# Patient Record
Sex: Female | Born: 1970 | Race: Black or African American | Hispanic: No | Marital: Married | Smoking: Current some day smoker
Health system: Southern US, Community
[De-identification: ages and names within clinical notes are randomized; demographics above are authoritative.]

## PROBLEM LIST (undated history)

## (undated) DIAGNOSIS — J302 Other seasonal allergic rhinitis: Secondary | ICD-10-CM

## (undated) DIAGNOSIS — I10 Essential (primary) hypertension: Secondary | ICD-10-CM

## (undated) DIAGNOSIS — E7800 Pure hypercholesterolemia, unspecified: Secondary | ICD-10-CM

## (undated) HISTORY — PX: TUBAL LIGATION: SHX77

---

## 2007-08-09 ENCOUNTER — Emergency Department (HOSPITAL_COMMUNITY): Admission: EM | Admit: 2007-08-09 | Discharge: 2007-08-09 | Payer: Self-pay | Admitting: Emergency Medicine

## 2008-03-29 ENCOUNTER — Emergency Department (HOSPITAL_COMMUNITY): Admission: EM | Admit: 2008-03-29 | Discharge: 2008-03-30 | Payer: Self-pay | Admitting: Emergency Medicine

## 2009-12-08 ENCOUNTER — Ambulatory Visit: Payer: Self-pay | Admitting: Diagnostic Radiology

## 2009-12-08 ENCOUNTER — Emergency Department (HOSPITAL_BASED_OUTPATIENT_CLINIC_OR_DEPARTMENT_OTHER): Admission: EM | Admit: 2009-12-08 | Discharge: 2009-12-08 | Payer: Self-pay | Admitting: Emergency Medicine

## 2014-12-08 ENCOUNTER — Emergency Department (HOSPITAL_BASED_OUTPATIENT_CLINIC_OR_DEPARTMENT_OTHER)
Admission: EM | Admit: 2014-12-08 | Discharge: 2014-12-08 | Disposition: A | Discharge status: Home / Self Care | Payer: Self-pay | Attending: Emergency Medicine | Admitting: Emergency Medicine

## 2014-12-08 ENCOUNTER — Emergency Department (HOSPITAL_BASED_OUTPATIENT_CLINIC_OR_DEPARTMENT_OTHER): Payer: Self-pay

## 2014-12-08 ENCOUNTER — Encounter (HOSPITAL_BASED_OUTPATIENT_CLINIC_OR_DEPARTMENT_OTHER): Payer: Self-pay | Admitting: Emergency Medicine

## 2014-12-08 DIAGNOSIS — Z8639 Personal history of other endocrine, nutritional and metabolic disease: Secondary | ICD-10-CM | POA: Insufficient documentation

## 2014-12-08 DIAGNOSIS — R52 Pain, unspecified: Secondary | ICD-10-CM

## 2014-12-08 DIAGNOSIS — M25562 Pain in left knee: Secondary | ICD-10-CM | POA: Insufficient documentation

## 2014-12-08 DIAGNOSIS — Z720 Tobacco use: Secondary | ICD-10-CM | POA: Insufficient documentation

## 2014-12-08 DIAGNOSIS — I10 Essential (primary) hypertension: Secondary | ICD-10-CM | POA: Insufficient documentation

## 2014-12-08 HISTORY — DX: Essential (primary) hypertension: I10

## 2014-12-08 HISTORY — DX: Other seasonal allergic rhinitis: J30.2

## 2014-12-08 HISTORY — DX: Pure hypercholesterolemia, unspecified: E78.00

## 2014-12-08 MED ORDER — IBUPROFEN 800 MG PO TABS: 800.0000 mg | ORAL_TABLET | Freq: Three times a day (TID) | ORAL | Status: DC

## 2014-12-08 MED ORDER — HYDROCODONE-ACETAMINOPHEN 5-325 MG PO TABS: 2.0000 | ORAL_TABLET | ORAL | Status: DC | PRN

## 2014-12-08 MED ORDER — IBUPROFEN 800 MG PO TABS
800.0000 mg | ORAL_TABLET | Freq: Once | ORAL | Status: AC
  Administered 2014-12-08: 800 mg via ORAL
  Filled 2014-12-08: qty 1

## 2014-12-08 NOTE — Discharge Instructions (Signed)

## 2014-12-08 NOTE — ED Notes (Signed)
Left knee pain and swelling since before Thanksgiving with no known injury.

## 2014-12-08 NOTE — ED Provider Notes (Signed)
CSN: [id number]     Arrival date & time 12/08/14  1729 History   First MD Initiated Contact with Patient 12/08/14 1740     Chief Complaint  Patient presents with  . Knee Pain     (Consider location/radiation/quality/duration/timing/severity/associated sxs/prior Treatment) HPI Comments: Constant left lateral knee pain since November 26. Denies any injury or fall. 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 Rest makes it better, putting weight on it makes it worse. No fevers, chills, nausea or vomiting. No chest pain or shortness of breath. No weakness, numbness or tingling. No other joint pain. Took ibuprofen 1 without relief.  The history is provided by the patient.    Past Medical History  Diagnosis Date  . Hypertension   . High cholesterol   . Seasonal allergies    Past Surgical History  Procedure Laterality Date  . Tubal ligation     No family history on file. History  Substance Use Topics  . Smoking status: Current Some Day Smoker  . Smokeless tobacco: Not on file  . Alcohol Use: Yes     Comment: occ   OB History    No data available     Review of Systems  Constitutional: Negative for fever, activity change and appetite change.  Respiratory: Negative for cough, chest tightness and shortness of breath.   Cardiovascular: Negative for chest pain.  Gastrointestinal: Negative for nausea, vomiting and abdominal pain.  Genitourinary: Negative for dysuria and hematuria.  Musculoskeletal: Positive for myalgias and arthralgias. Negative for back pain.  Skin: Negative for rash.  Neurological: Negative for dizziness, weakness and headaches.  A complete 10 system review of systems was obtained and all systems are negative except as noted in the HPI and PMH.      Allergies  Percocet  Home Medications   Prior to Admission medications   Medication Sig Start Date End Date Taking? Authorizing Provider  HYDROcodone-acetaminophen (NORCO/VICODIN)  5-325 MG per tablet Take 2 tablets by mouth every 4 (four) hours as needed. 12/08/14   Glynn OctaveStephen Inari Shin, MD  ibuprofen (ADVIL,MOTRIN) 800 MG tablet Take 1 tablet (800 mg total) by mouth 3 (three) times daily. 12/08/14   Glynn OctaveStephen Cherokee Clowers, MD   BP 143/98 mmHg  Pulse 87  Temp(Src) 98.5 F (36.9 C) (Oral)  Resp 18  Ht 5\' 7"  (1.702 m)  SpO2 100%  LMP 08/27/2014 (Approximate) Physical Exam  Constitutional: She is oriented to person, place, and time. She appears well-developed and well-nourished. No distress.  HENT:  Head: Normocephalic and atraumatic.  Mouth/Throat: Oropharynx is clear and moist. No oropharyngeal exudate.  Eyes: Conjunctivae and EOM are normal. Pupils are equal, round, and reactive to light.  Neck: Normal range of motion. Neck supple.  No meningismus.  Cardiovascular: Normal rate, regular rhythm, normal heart sounds and intact distal pulses.   No murmur heard. Pulmonary/Chest: Effort normal and breath sounds normal. No respiratory distress.  Abdominal: Soft. There is no tenderness. There is no rebound and no guarding.  Musculoskeletal: Normal range of motion. She exhibits tenderness. She exhibits no edema.  Left knee with minimal effusion. Tenderness to lateral joint line. No ligament laxity. Flexion and extension intact. Intact distal pulses. No calf tenderness No erythema or overlying skin change   Neurological: She is alert and oriented to person, place, and time. No cranial nerve deficit. She exhibits normal muscle tone. Coordination normal.  No ataxia on finger to nose bilaterally. No pronator drift. 5/5 strength throughout. CN  2-12 intact. Negative Romberg. Equal grip strength. Sensation intact. Gait is normal.   Skin: Skin is warm.  Psychiatric: She has a normal mood and affect. Her behavior is normal.  Nursing note and vitals reviewed.   ED Course  Procedures (including critical care time) Labs Review Labs Reviewed - No data to display  Imaging Review Dg Knee  Complete 4 Views Left  12/08/2014   CLINICAL DATA:  Lateral knee pain and swelling for 3 weeks. Painful weight-bearing. No known injury.  EXAM: LEFT KNEE - COMPLETE 4+ VIEW  COMPARISON:  None.  FINDINGS: The mineralization and alignment are normal. There is no evidence of acute fracture or dislocation. There is mild medial joint space narrowing. There is a moderate size knee joint effusion. Lucency in the proximal tibial metaphysis appears nonaggressive.  IMPRESSION: 1. No acute osseous findings demonstrated. 2. Moderate-sized joint effusion may be a manifestation of internal derangement. Consider further evaluation with elective MRI.   Electronically Signed   By: Roxy HorsemanBill  Veazey M.D.   On: 12/08/2014 18:34     EKG Interpretation None      MDM   Final diagnoses:  Left knee pain   Atraumatic left knee pain. No evidence of septic joint. Neurovascularly intact  X-ray shows small joint effusion. Patient with full range of motion. No fever or erythema. Doubt septic joint.  Patient will be placed in knee immobilizer, crutches, follow-up with sports medicine. Likely needs MRI of her knee to assess for internal derangement.  Glynn OctaveStephen Sajan Cheatwood, MD 12/09/14 [phone]

## 2016-07-13 IMAGING — CR DG KNEE COMPLETE 4+V*L*
5 series · 5 of 5 positions shown · non-contrast
Comparison: None.

CLINICAL DATA: Lateral knee pain and swelling for 3 weeks. Painful
weight-bearing. No known injury.

EXAM:
LEFT KNEE - COMPLETE 4+ VIEW

[t knee ap left]
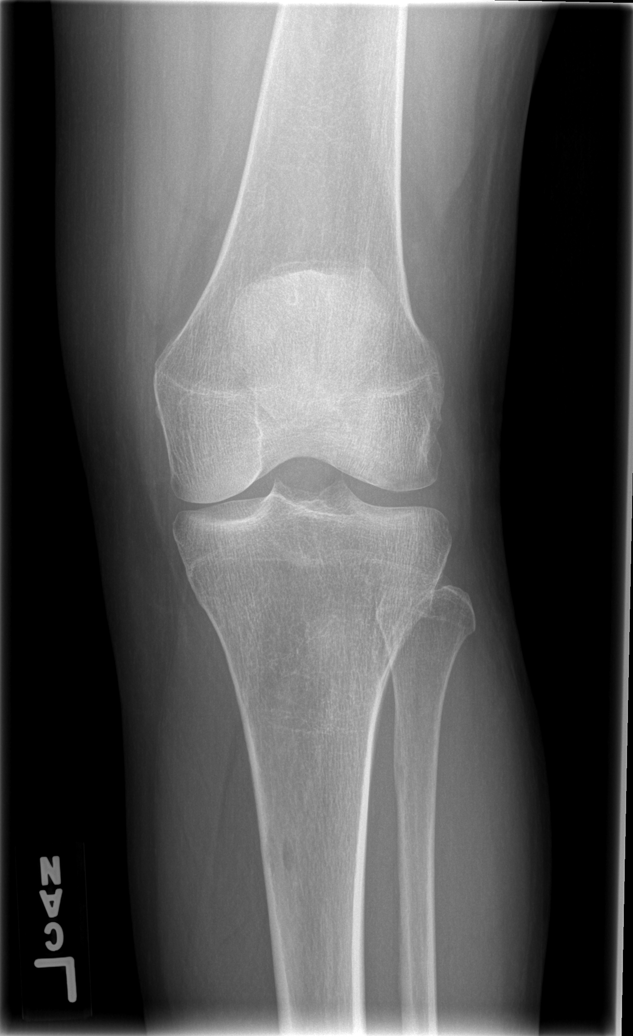

[t knee oblique left (1 of 2)]
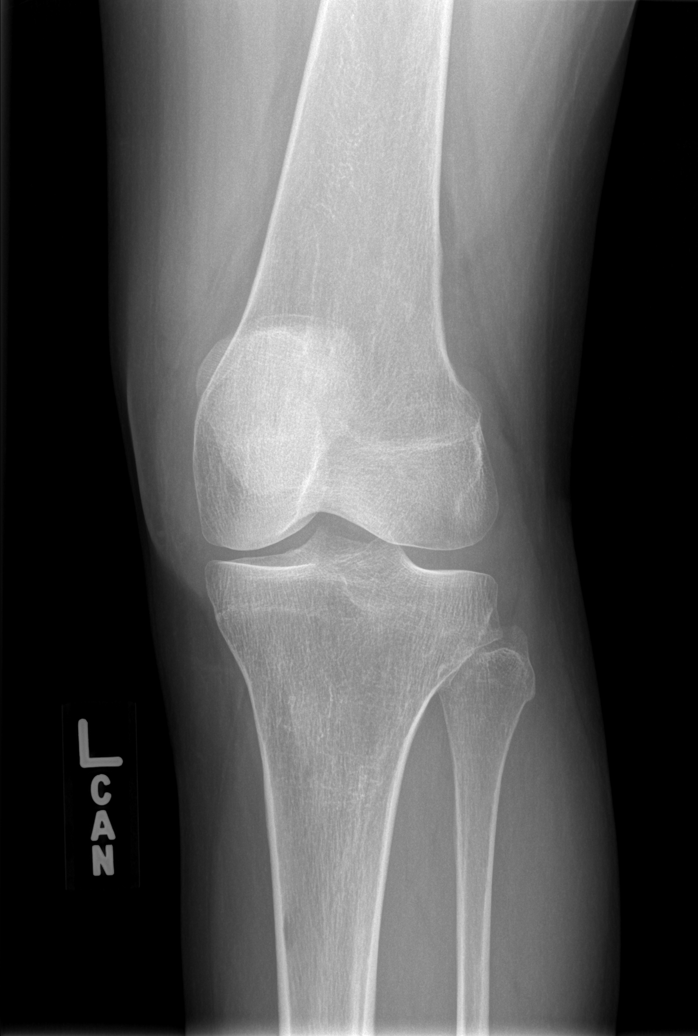

[t knee oblique left (2 of 2)]
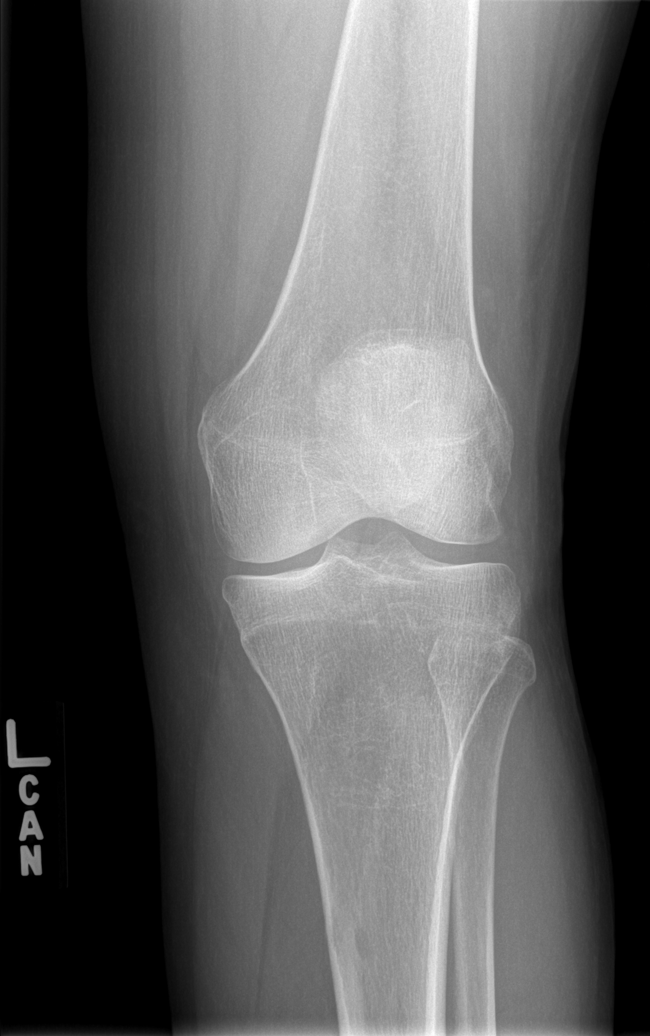

[t knee lat left (1 of 2)]
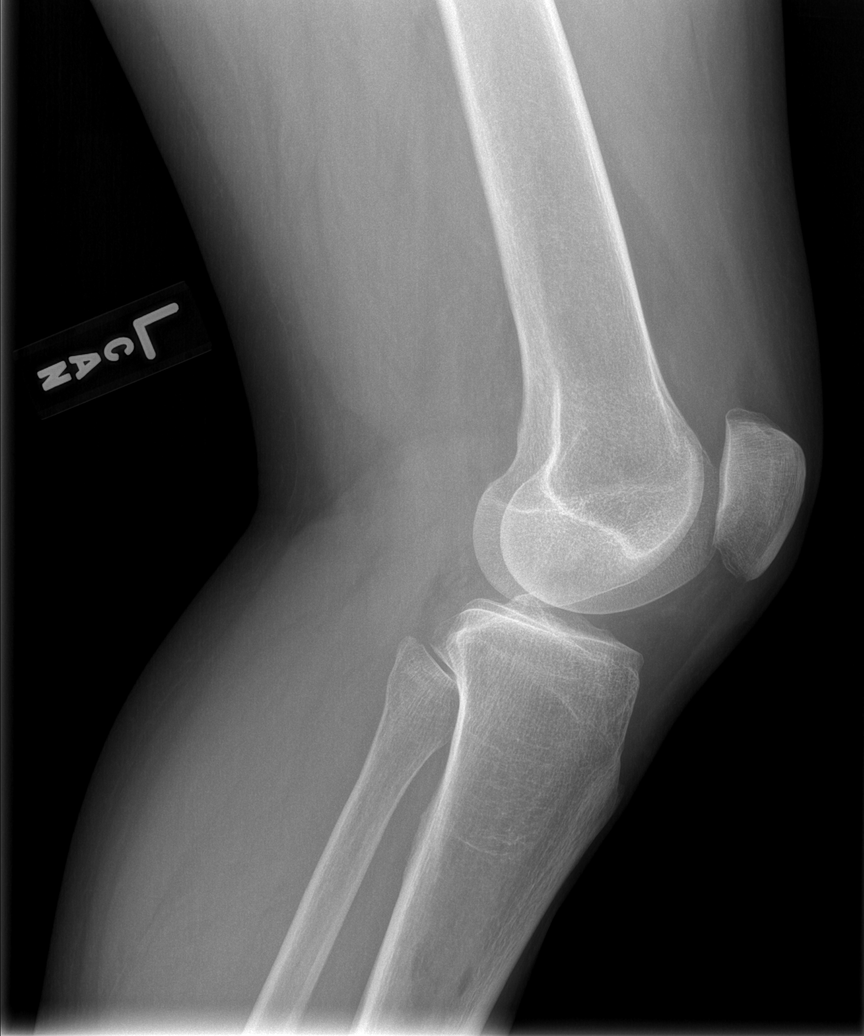

[t knee lat left (2 of 2)]
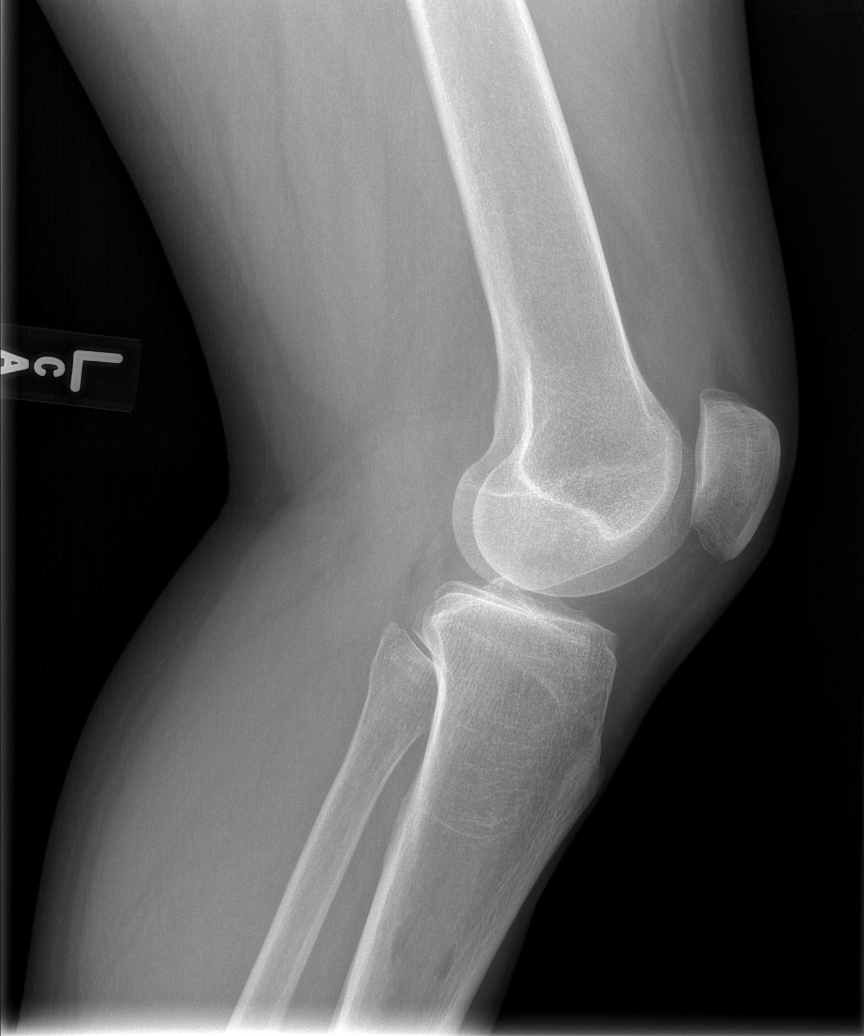

[5 of 5 positions shown; findings below may reference images not displayed]

FINDINGS: The mineralization and alignment are normal. There is no evidence of
acute fracture or dislocation. There is mild medial joint space
narrowing. There is a moderate size knee joint effusion. Lucency in
the proximal tibial metaphysis appears nonaggressive.
IMPRESSION: 1. No acute osseous findings demonstrated.
2. Moderate-sized joint effusion may be a manifestation of internal
derangement. Consider further evaluation with elective MRI.

## 2022-05-13 ENCOUNTER — Encounter: Payer: Self-pay | Admitting: Family Medicine

## 2022-05-13 ENCOUNTER — Ambulatory Visit (INDEPENDENT_AMBULATORY_CARE_PROVIDER_SITE_OTHER): Payer: 59 | Admitting: Family Medicine

## 2022-05-13 VITALS — BP 157/118 | HR 107 | Temp 98.1°F | Resp 16 | Ht 67.0 in | Wt 161.2 lb

## 2022-05-13 DIAGNOSIS — I10 Essential (primary) hypertension: Secondary | ICD-10-CM | POA: Diagnosis not present

## 2022-05-13 DIAGNOSIS — E785 Hyperlipidemia, unspecified: Secondary | ICD-10-CM | POA: Insufficient documentation

## 2022-05-13 DIAGNOSIS — R7303 Prediabetes: Secondary | ICD-10-CM | POA: Insufficient documentation

## 2022-05-13 DIAGNOSIS — E559 Vitamin D deficiency, unspecified: Secondary | ICD-10-CM | POA: Insufficient documentation

## 2022-05-13 DIAGNOSIS — R69 Illness, unspecified: Secondary | ICD-10-CM | POA: Diagnosis not present

## 2022-05-13 DIAGNOSIS — F411 Generalized anxiety disorder: Secondary | ICD-10-CM | POA: Diagnosis not present

## 2022-05-13 DIAGNOSIS — Z23 Encounter for immunization: Secondary | ICD-10-CM

## 2022-05-13 DIAGNOSIS — I119 Hypertensive heart disease without heart failure: Secondary | ICD-10-CM | POA: Insufficient documentation

## 2022-05-13 DIAGNOSIS — I517 Cardiomegaly: Secondary | ICD-10-CM | POA: Insufficient documentation

## 2022-05-13 DIAGNOSIS — Z720 Tobacco use: Secondary | ICD-10-CM | POA: Insufficient documentation

## 2022-05-13 MED ORDER — HYDROCHLOROTHIAZIDE 25 MG PO TABS: ORAL_TABLET | ORAL | 0 refills | Status: DC

## 2022-05-13 MED ORDER — PAROXETINE HCL 10 MG PO TABS: 10.0000 mg | ORAL_TABLET | Freq: Every day | ORAL | 1 refills | Status: DC

## 2022-05-13 NOTE — Progress Notes (Signed)
Patient is here to establish care with. Patient would to talk with provider about her HTN. ? ?After obtaining informed consent, shingrix, immunization is given by Kieth Brightly ? ?

## 2022-05-14 ENCOUNTER — Encounter: Payer: Self-pay | Admitting: Family Medicine

## 2022-05-14 NOTE — Progress Notes (Signed)
? ?New Patient Office Visit ? ?Subjective   ? ?Patient ID: Jade Schmidt, female    DOB: [birth year]  Age: 51 y.o. MRN: [id number] ? ?CC:  ?Chief Complaint  ?Patient presents with  ? Establish Care  ? ? ?HPI ?Jade Schmidt presents to establish care and foir review of chronic med issues including hypertension. Patient also reports that she has been having anxiety that has been increasing over the last several weeks. She reports increasing social stressors.  ? ? ?Outpatient Encounter Medications as of 05/13/2022  ?Medication Sig  ? PARoxetine (PAXIL) 10 MG tablet Take 1 tablet (10 mg total) by mouth daily.  ? hydrochlorothiazide (HYDRODIURIL) 25 MG tablet 1 tab(s) orally once a day for 30 day(s)  ? VITAMIN D PO 1 tab(s) orally once a day for 30 day(s)  ? [DISCONTINUED] hydrochlorothiazide (HYDRODIURIL) 25 MG tablet 1 tab(s) orally once a day for 30 day(s)  ? [DISCONTINUED] HYDROcodone-acetaminophen (NORCO/VICODIN) 5-325 MG per tablet Take 2 tablets by mouth every 4 (four) hours as needed.  ? [DISCONTINUED] ibuprofen (ADVIL,MOTRIN) 800 MG tablet Take 1 tablet (800 mg total) by mouth 3 (three) times daily.  ? ?No facility-administered encounter medications on file as of 05/13/2022.  ? ? ?Past Medical History:  ?Diagnosis Date  ? High cholesterol   ? Hypertension   ? Seasonal allergies   ? ? ?Past Surgical History:  ?Procedure Laterality Date  ? TUBAL LIGATION    ? ? ?History reviewed. No pertinent family history. ? ?Social History  ? ?Socioeconomic History  ? Marital status: Married  ?  Spouse name: Not on file  ? Number of children: Not on file  ? Years of education: Not on file  ? Highest education level: Not on file  ?Occupational History  ? Not on file  ?Tobacco Use  ? Smoking status: Some Days  ? Smokeless tobacco: Not on file  ?Substance and Sexual Activity  ? Alcohol use: Yes  ?  Comment: occ  ? Drug use: Not Currently  ? Sexual activity: Yes  ?  Birth control/protection: Surgical  ?Other Topics Concern  ? Not on  file  ?Social History Narrative  ? Not on file  ? ?Social Determinants of Health  ? ?Financial Resource Strain: Not on file  ?Food Insecurity: Not on file  ?Transportation Needs: Not on file  ?Physical Activity: Not on file  ?Stress: Not on file  ?Social Connections: Not on file  ?Intimate Partner Violence: Not on file  ? ? ?Review of Systems  ?Cardiovascular: Negative.   ?Psychiatric/Behavioral:  Negative for depression and suicidal ideas. The patient is nervous/anxious.   ?All other systems reviewed and are negative. ? ?  ? ? ?Objective   ? ?BP (!) 157/118   Pulse (!) 107   Temp 98.1 ?F (36.7 ?C) (Oral)   Resp 16   Ht 5\' 7"  (1.702 m)   Wt 161 lb 3.2 oz (73.1 kg)   LMP 08/27/2014 (Approximate)   SpO2 98%   BMI 25.25 kg/m?  ? ?Physical Exam ?Vitals and nursing note reviewed.  ?Constitutional:   ?   General: She is not in acute distress. ?Cardiovascular:  ?   Rate and Rhythm: Normal rate and regular rhythm.  ?Pulmonary:  ?   Effort: Pulmonary effort is normal.  ?   Breath sounds: Normal breath sounds.  ?Abdominal:  ?   Palpations: Abdomen is soft.  ?   Tenderness: There is no abdominal tenderness.  ?Neurological:  ?   General:  No focal deficit present.  ?   Mental Status: She is alert and oriented to person, place, and time.  ?Psychiatric:     ?   Mood and Affect: Mood normal.     ?   Behavior: Behavior normal.  ? ? ? ?  ? ?Assessment & Plan:  ? ?1. Essential hypertension ?Elevate readings. Discussed compliance. HCTZ refilled. monitor ? ?2. Anxiety state ?Paxil 10 mg daily prescribed. monitor ? ?3. Need for shingles vaccine ? ?- Varicella-zoster vaccine IM ? ? ? ?Return in about 4 weeks (around 06/10/2022) for follow up.  ? ?Becky Sax, MD ? ? ?

## 2022-06-13 ENCOUNTER — Encounter: Payer: Self-pay | Admitting: Family Medicine

## 2022-06-13 ENCOUNTER — Ambulatory Visit (INDEPENDENT_AMBULATORY_CARE_PROVIDER_SITE_OTHER): Payer: 59 | Admitting: Family Medicine

## 2022-06-13 VITALS — BP 146/94 | HR 100 | Temp 98.1°F | Resp 16 | Wt 163.3 lb

## 2022-06-13 DIAGNOSIS — F411 Generalized anxiety disorder: Secondary | ICD-10-CM | POA: Diagnosis not present

## 2022-06-13 DIAGNOSIS — R69 Illness, unspecified: Secondary | ICD-10-CM | POA: Diagnosis not present

## 2022-06-13 DIAGNOSIS — I10 Essential (primary) hypertension: Secondary | ICD-10-CM

## 2022-06-13 DIAGNOSIS — G4700 Insomnia, unspecified: Secondary | ICD-10-CM | POA: Diagnosis not present

## 2022-06-13 DIAGNOSIS — Z23 Encounter for immunization: Secondary | ICD-10-CM | POA: Diagnosis not present

## 2022-06-13 MED ORDER — HYDROCHLOROTHIAZIDE 25 MG PO TABS: 25.0000 mg | ORAL_TABLET | Freq: Every day | ORAL | 0 refills | Status: DC

## 2022-06-13 MED ORDER — LISINOPRIL 10 MG PO TABS: 10.0000 mg | ORAL_TABLET | Freq: Every day | ORAL | 0 refills | Status: DC

## 2022-06-13 MED ORDER — MIRTAZAPINE 15 MG PO TABS: 15.0000 mg | ORAL_TABLET | Freq: Every day | ORAL | 1 refills | Status: DC

## 2022-06-13 NOTE — Progress Notes (Signed)
Established Patient Office Visit  Subjective    Patient ID: Jade Schmidt, female    DOB: 12/29/1971  Age: 51 y.o. MRN: [id number]  CC: No chief complaint on file.   HPI Jade Schmidt presents for routine follow up of hypertension and anxiety. Patient reports that she stopped paxil within 7 days. She continues to have insomnia.    Outpatient Encounter Medications as of 06/13/2022  Medication Sig   lisinopril (ZESTRIL) 10 MG tablet Take 1 tablet (10 mg total) by mouth daily.   mirtazapine (REMERON) 15 MG tablet Take 1 tablet (15 mg total) by mouth at bedtime.   hydrochlorothiazide (HYDRODIURIL) 25 MG tablet Take 1 tablet (25 mg total) by mouth daily.   VITAMIN D PO 1 tab(s) orally once a day for 30 day(s)   [DISCONTINUED] hydrochlorothiazide (HYDRODIURIL) 25 MG tablet 1 tab(s) orally once a day for 30 day(s)   [DISCONTINUED] PARoxetine (PAXIL) 10 MG tablet Take 1 tablet (10 mg total) by mouth daily.   No facility-administered encounter medications on file as of 06/13/2022.    Past Medical History:  Diagnosis Date   High cholesterol    Hypertension    Seasonal allergies     Past Surgical History:  Procedure Laterality Date   TUBAL LIGATION      History reviewed. No pertinent family history.  Social History   Socioeconomic History   Marital status: Married    Spouse name: Not on file   Number of children: Not on file   Years of education: Not on file   Highest education level: Not on file  Occupational History   Not on file  Tobacco Use   Smoking status: Some Days   Smokeless tobacco: Not on file  Substance and Sexual Activity   Alcohol use: Yes    Comment: occ   Drug use: Not Currently   Sexual activity: Yes    Birth control/protection: Surgical  Other Topics Concern   Not on file  Social History Narrative   Not on file   Social Determinants of Health   Financial Resource Strain: Not on file  Food Insecurity: Not on file  Transportation Needs: Not on  file  Physical Activity: Not on file  Stress: Not on file  Social Connections: Not on file  Intimate Partner Violence: Not on file    Review of Systems  Psychiatric/Behavioral:  Negative for depression and suicidal ideas. The patient is nervous/anxious and has insomnia.   All other systems reviewed and are negative.       Objective    BP (!) 146/94   Pulse 100   Temp 98.1 F (36.7 C) (Oral)   Resp 16   Wt 163 lb 4.8 oz (74.1 kg)   LMP 08/27/2014 (Approximate)   SpO2 94%   BMI 25.58 kg/m   Physical Exam Vitals and nursing note reviewed.  Constitutional:      General: She is not in acute distress. Cardiovascular:     Rate and Rhythm: Normal rate and regular rhythm.  Pulmonary:     Effort: Pulmonary effort is normal.     Breath sounds: Normal breath sounds.  Abdominal:     Palpations: Abdomen is soft.     Tenderness: There is no abdominal tenderness.  Neurological:     General: No focal deficit present.     Mental Status: She is alert and oriented to person, place, and time.  Psychiatric:        Mood and Affect: Mood normal.  Behavior: Behavior normal.         Assessment & Plan:   1. Essential hypertension Readings slightly above goal. Lisinopril 10 mg daily added to regimen. monitor  2. Anxiety state Paxil d/c. Remeron 15 mg prescribed. monitor  3. Insomnia, unspecified type As above  4. Need for shingles vaccine  - Varicella-zoster vaccine IM    Return in about 4 weeks (around 07/11/2022) for follow up.   Tommie Raymond, MD

## 2022-07-05 ENCOUNTER — Other Ambulatory Visit: Payer: Self-pay | Admitting: Family Medicine

## 2022-07-07 NOTE — Telephone Encounter (Signed)
Requested medication (s) are due for refill today: no  Requested medication (s) are on the active medication list: yes  Last refill:  06/13/22  Future visit scheduled: no  Notes to clinic:  Unable to refill per protocol, last refill by provider 06/03/22. Pharmacy request 90 day supply.     Requested Prescriptions  Pending Prescriptions Disp Refills   mirtazapine (REMERON) 15 MG tablet [Pharmacy Med Name: MIRTAZAPINE 15 MG TABLET] 90 tablet 1    Sig: TAKE 1 TABLET BY MOUTH EVERYDAY AT BEDTIME     Psychiatry: Antidepressants - mirtazapine Passed - 07/05/2022  9:31 AM      Passed - Valid encounter within last 6 months    Recent Outpatient Visits           3 weeks ago Essential hypertension   [facility], MD   1 month ago Essential hypertension   [facility] at [facility], MD       Future Appointments             In 4 days Georganna Skeans, MD [facility] at [facility]

## 2022-07-11 ENCOUNTER — Ambulatory Visit: Payer: 59 | Admitting: Family Medicine

## 2022-08-22 ENCOUNTER — Encounter: Payer: Self-pay | Admitting: Family Medicine

## 2022-08-22 ENCOUNTER — Telehealth: Payer: Self-pay | Admitting: Family Medicine

## 2022-08-22 ENCOUNTER — Ambulatory Visit (INDEPENDENT_AMBULATORY_CARE_PROVIDER_SITE_OTHER): Payer: 59 | Admitting: Family Medicine

## 2022-08-22 VITALS — BP 134/94 | HR 93 | Temp 97.7°F | Resp 16 | Wt 169.8 lb

## 2022-08-22 DIAGNOSIS — I10 Essential (primary) hypertension: Secondary | ICD-10-CM

## 2022-08-22 DIAGNOSIS — G4700 Insomnia, unspecified: Secondary | ICD-10-CM

## 2022-08-22 DIAGNOSIS — F411 Generalized anxiety disorder: Secondary | ICD-10-CM

## 2022-08-22 MED ORDER — LISINOPRIL 10 MG PO TABS: 10.0000 mg | ORAL_TABLET | Freq: Every day | ORAL | 1 refills | Status: AC

## 2022-08-22 MED ORDER — BUSPIRONE HCL 10 MG PO TABS: 10.0000 mg | ORAL_TABLET | Freq: Two times a day (BID) | ORAL | 1 refills | Status: DC

## 2022-08-22 MED ORDER — HYDROCHLOROTHIAZIDE 25 MG PO TABS: 25.0000 mg | ORAL_TABLET | Freq: Every day | ORAL | 1 refills | Status: DC

## 2022-08-22 NOTE — Telephone Encounter (Signed)
Called pt to schedule PHYSICAL appt w/ PCP for approximately beginning of December 2023. No answer, could not lvm.

## 2022-08-25 ENCOUNTER — Encounter: Payer: Self-pay | Admitting: Family Medicine

## 2022-08-25 NOTE — Progress Notes (Signed)
Established Patient Office Visit  Subjective    Patient ID: Jade Schmidt, female    DOB: 04/22/1971  Age: 51 y.o. MRN: [id number]  CC:  Chief Complaint  Patient presents with   Follow-up   Hypertension    HPI Jade Schmidt presents for routine follow up of chronic med issues including hypertension. Patient denies acute complaints or concerns.    Outpatient Encounter Medications as of 08/22/2022  Medication Sig   aspirin EC 81 MG tablet Take 1 tablet by mouth daily.   busPIRone (BUSPAR) 10 MG tablet Take 1 tablet (10 mg total) by mouth 2 (two) times daily.   hydrochlorothiazide (HYDRODIURIL) 25 MG tablet Take 1 tablet (25 mg total) by mouth daily.   lisinopril (ZESTRIL) 10 MG tablet Take 1 tablet (10 mg total) by mouth daily.   VITAMIN D PO 1 tab(s) orally once a day for 30 day(s)   [DISCONTINUED] busPIRone (BUSPAR) 10 MG tablet Take 1 tablet by mouth 2 (two) times daily.   [DISCONTINUED] hydrochlorothiazide (HYDRODIURIL) 25 MG tablet Take 1 tablet (25 mg total) by mouth daily.   [DISCONTINUED] hydrochlorothiazide (HYDRODIURIL) 25 MG tablet Take 1 tablet by mouth daily.   [DISCONTINUED] lisinopril (ZESTRIL) 10 MG tablet Take 1 tablet (10 mg total) by mouth daily.   [DISCONTINUED] mirtazapine (REMERON) 15 MG tablet Take 1 tablet (15 mg total) by mouth at bedtime.   No facility-administered encounter medications on file as of 08/22/2022.    Past Medical History:  Diagnosis Date   High cholesterol    Hypertension    Seasonal allergies     Past Surgical History:  Procedure Laterality Date   TUBAL LIGATION      No family history on file.  Social History   Socioeconomic History   Marital status: Married    Spouse name: Not on file   Number of children: Not on file   Years of education: Not on file   Highest education level: Not on file  Occupational History   Not on file  Tobacco Use   Smoking status: Some Days   Smokeless tobacco: Not on file  Substance and  Sexual Activity   Alcohol use: Yes    Comment: occ   Drug use: Not Currently   Sexual activity: Yes    Birth control/protection: Surgical  Other Topics Concern   Not on file  Social History Narrative   Not on file   Social Determinants of Health   Financial Resource Strain: Not on file  Food Insecurity: Not on file  Transportation Needs: Not on file  Physical Activity: Not on file  Stress: Not on file  Social Connections: Not on file  Intimate Partner Violence: Not on file    Review of Systems  All other systems reviewed and are negative.       Objective    BP (!) 134/94 Comment: pt out of medication  Pulse 93   Temp 97.7 F (36.5 C) (Oral)   Resp 16   Wt 169 lb 12.8 oz (77 kg)   LMP 08/27/2014 (Approximate)   SpO2 96%   BMI 26.59 kg/m   Physical Exam Vitals and nursing note reviewed.  Constitutional:      General: She is not in acute distress. Cardiovascular:     Rate and Rhythm: Normal rate and regular rhythm.  Pulmonary:     Effort: Pulmonary effort is normal.     Breath sounds: Normal breath sounds.  Abdominal:     Palpations: Abdomen  is soft.     Tenderness: There is no abdominal tenderness.  Neurological:     General: No focal deficit present.     Mental Status: She is alert and oriented to person, place, and time.  Psychiatric:        Mood and Affect: Mood normal.        Behavior: Behavior normal.         Assessment & Plan:   1. Essential hypertension Slight improvement but still slightly above goal.   2. Anxiety state Appears stable. continue  3. Insomnia, unspecified type     Return in about 3 months (around 11/22/2022) for physical.   Tommie Raymond, MD

## 2024-06-15 ENCOUNTER — Telehealth: Payer: Self-pay | Admitting: *Deleted

## 2024-06-15 ENCOUNTER — Encounter: Payer: Self-pay | Admitting: Family Medicine

## 2024-06-15 ENCOUNTER — Ambulatory Visit (INDEPENDENT_AMBULATORY_CARE_PROVIDER_SITE_OTHER): Payer: Self-pay | Admitting: Family Medicine

## 2024-06-15 VITALS — BP 145/92 | HR 99

## 2024-06-15 DIAGNOSIS — I10 Essential (primary) hypertension: Secondary | ICD-10-CM

## 2024-06-15 DIAGNOSIS — F411 Generalized anxiety disorder: Secondary | ICD-10-CM

## 2024-06-15 DIAGNOSIS — F17210 Nicotine dependence, cigarettes, uncomplicated: Secondary | ICD-10-CM

## 2024-06-15 MED ORDER — HYDROCHLOROTHIAZIDE 25 MG PO TABS: 25.0000 mg | ORAL_TABLET | Freq: Every day | ORAL | 1 refills | Status: DC

## 2024-06-15 MED ORDER — BUPROPION HCL ER (SR) 150 MG PO TB12: 150.0000 mg | ORAL_TABLET | Freq: Two times a day (BID) | ORAL | 1 refills | Status: AC

## 2024-06-15 MED ORDER — BUSPIRONE HCL 10 MG PO TABS: 10.0000 mg | ORAL_TABLET | Freq: Two times a day (BID) | ORAL | 1 refills | Status: AC

## 2024-06-15 NOTE — Telephone Encounter (Signed)
 PT wants to know if she can be billed for her copay today. Please call her she has an appt today

## 2024-06-15 NOTE — Telephone Encounter (Signed)
 Called pt and could not leave vm. Sent text message to pt and let her know that she may come in for her appointment and request a bill during check-in

## 2024-06-15 NOTE — Progress Notes (Signed)
 Established Patient Office Visit  Subjective    Patient ID: Jade Schmidt, female    DOB: July 02, 1971  Age: 53 y.o. MRN: [id number]  CC:  Chief Complaint  Patient presents with   Hypertension   Anxiety    HPI Jade Schmidt presents for follow up of chronic med issues including hypertension and anxiety. Patient would also like to stop smoking. Patient has not had BP meds for months. Patient denies acute complaints.   Outpatient Encounter Medications as of 06/15/2024  Medication Sig   aspirin EC 81 MG tablet Take 1 tablet by mouth daily.   buPROPion (WELLBUTRIN SR) 150 MG 12 hr tablet Take 1 tablet (150 mg total) by mouth 2 (two) times daily.   busPIRone  (BUSPAR ) 10 MG tablet Take 1 tablet (10 mg total) by mouth 2 (two) times daily.   hydrochlorothiazide  (HYDRODIURIL ) 25 MG tablet Take 1 tablet (25 mg total) by mouth daily.   lisinopril  (ZESTRIL ) 10 MG tablet Take 1 tablet (10 mg total) by mouth daily.   VITAMIN D PO 1 tab(s) orally once a day for 30 day(s)   [DISCONTINUED] buPROPion (WELLBUTRIN SR) 150 MG 12 hr tablet Take one tablet in the morning for 3 days. Then increase to one tablet in the morning and one tablet in the early afternoon. (Before 3pm).   [DISCONTINUED] busPIRone  (BUSPAR ) 10 MG tablet Take 1 tablet (10 mg total) by mouth 2 (two) times daily.   [DISCONTINUED] hydrochlorothiazide  (HYDRODIURIL ) 25 MG tablet Take 1 tablet (25 mg total) by mouth daily.   No facility-administered encounter medications on file as of 06/15/2024.    Past Medical History:  Diagnosis Date   High cholesterol    Hypertension    Seasonal allergies     Past Surgical History:  Procedure Laterality Date   TUBAL LIGATION      History reviewed. No pertinent family history.  Social History   Socioeconomic History   Marital status: Married    Spouse name: Not on file   Number of children: Not on file   Years of education: Not on file   Highest education level: Not on file  Occupational  History   Not on file  Tobacco Use   Smoking status: Some Days   Smokeless tobacco: Not on file  Substance and Sexual Activity   Alcohol use: Yes    Comment: occ   Drug use: Not Currently   Sexual activity: Yes    Birth control/protection: Surgical  Other Topics Concern   Not on file  Social History Narrative   Not on file   Social Drivers of Health   Financial Resource Strain: Not on file  Food Insecurity: Not on file  Transportation Needs: Not on file  Physical Activity: Not on file  Stress: Not on file  Social Connections: Not on file  Intimate Partner Violence: Not on file    Review of Systems  All other systems reviewed and are negative.       Objective    BP (!) 145/92 (BP Location: Right Arm, Patient Position: Sitting, Cuff Size: Normal)   Pulse 99   LMP 08/27/2014 (Approximate)   SpO2 98%   Physical Exam Vitals and nursing note reviewed.  Constitutional:      General: She is not in acute distress.  Cardiovascular:     Rate and Rhythm: Normal rate and regular rhythm.  Pulmonary:     Effort: Pulmonary effort is normal.     Breath sounds: Normal breath sounds.  Abdominal:     Palpations: Abdomen is soft.     Tenderness: There is no abdominal tenderness.   Neurological:     General: No focal deficit present.     Mental Status: She is alert and oriented to person, place, and time.   Psychiatric:        Mood and Affect: Mood normal.        Behavior: Behavior normal.         Assessment & Plan:   Essential hypertension  Anxiety state  Smoking greater than 20 pack years  Other orders -     hydroCHLOROthiazide ; Take 1 tablet (25 mg total) by mouth daily.  Dispense: 90 tablet; Refill: 1 -     buPROPion HCl ER (SR); Take 1 tablet (150 mg total) by mouth 2 (two) times daily.  Dispense: 180 tablet; Refill: 1 -     busPIRone  HCl; Take 1 tablet (10 mg total) by mouth 2 (two) times daily.  Dispense: 180 tablet; Refill: 1     Return in about 3  months (around 09/15/2024) for follow up.   Tanda Raguel SQUIBB, MD

## 2024-06-16 ENCOUNTER — Encounter: Payer: Self-pay | Admitting: Family Medicine

## 2024-09-09 ENCOUNTER — Ambulatory Visit: Payer: Self-pay | Admitting: Family Medicine

## 2024-09-19 ENCOUNTER — Other Ambulatory Visit: Payer: Self-pay | Admitting: Family Medicine
